# Patient Record
Sex: Male | Born: 1985 | Race: Asian | Hispanic: No | Marital: Married | Smoking: Current some day smoker
Health system: Southern US, Community
[De-identification: ages and names within clinical notes are randomized; demographics above are authoritative.]

## PROBLEM LIST (undated history)

## (undated) HISTORY — PX: LEG SURGERY: SHX1003

---

## 2020-11-18 ENCOUNTER — Other Ambulatory Visit: Payer: Self-pay

## 2020-11-18 ENCOUNTER — Emergency Department (HOSPITAL_BASED_OUTPATIENT_CLINIC_OR_DEPARTMENT_OTHER)
Admission: EM | Admit: 2020-11-18 | Discharge: 2020-11-18 | Disposition: A | Discharge status: Home / Self Care | Payer: PPO | Attending: Emergency Medicine | Admitting: Emergency Medicine

## 2020-11-18 ENCOUNTER — Emergency Department (HOSPITAL_BASED_OUTPATIENT_CLINIC_OR_DEPARTMENT_OTHER): Payer: PPO

## 2020-11-18 DIAGNOSIS — S99922A Unspecified injury of left foot, initial encounter: Secondary | ICD-10-CM | POA: Diagnosis present

## 2020-11-18 DIAGNOSIS — W208XXA Other cause of strike by thrown, projected or falling object, initial encounter: Secondary | ICD-10-CM | POA: Diagnosis not present

## 2020-11-18 DIAGNOSIS — S92425B Nondisplaced fracture of distal phalanx of left great toe, initial encounter for open fracture: Secondary | ICD-10-CM | POA: Diagnosis not present

## 2020-11-18 DIAGNOSIS — S97102A Crushing injury of unspecified left toe(s), initial encounter: Secondary | ICD-10-CM

## 2020-11-18 DIAGNOSIS — Y9239 Other specified sports and athletic area as the place of occurrence of the external cause: Secondary | ICD-10-CM | POA: Diagnosis not present

## 2020-11-18 DIAGNOSIS — S91209A Unspecified open wound of unspecified toe(s) with damage to nail, initial encounter: Secondary | ICD-10-CM

## 2020-11-18 MED ORDER — CEPHALEXIN 500 MG PO CAPS: 500.0000 mg | ORAL_CAPSULE | Freq: Three times a day (TID) | ORAL | 0 refills | Status: DC

## 2020-11-18 MED ORDER — LIDOCAINE HCL 2 % IJ SOLN
10.0000 mL | Freq: Once | INTRAMUSCULAR | Status: AC
  Administered 2020-11-18: 200 mg
  Filled 2020-11-18: qty 20

## 2020-11-18 MED ORDER — TETANUS-DIPHTH-ACELL PERTUSSIS 5-2.5-18.5 LF-MCG/0.5 IM SUSY
0.5000 mL | PREFILLED_SYRINGE | Freq: Once | INTRAMUSCULAR | Status: DC
  Filled 2020-11-18: qty 0.5

## 2020-11-18 MED ORDER — CEFAZOLIN SODIUM-DEXTROSE 1-4 GM/50ML-% IV SOLN
1.0000 g | Freq: Once | INTRAVENOUS | Status: AC
  Administered 2020-11-18: 1 g via INTRAVENOUS
  Filled 2020-11-18: qty 50

## 2020-11-18 MED ORDER — OXYCODONE HCL 5 MG PO TABS: 5.0000 mg | ORAL_TABLET | ORAL | 0 refills | Status: DC | PRN

## 2020-11-18 NOTE — ED Notes (Signed)
Consultation called to [facility] at this time for Orthopedic Surgery to [facility] by this RN.

## 2020-11-18 NOTE — ED Notes (Signed)
Pt verbalizes understanding of discharge instructions. Opportunity for questioning and answers were provided. Armand removed by staff, pt discharged from ED to home. Educated on use of crutches and post-shoes.

## 2020-11-18 NOTE — ED Provider Notes (Signed)
[facility] EMERGENCY DEPT Provider Note   CSN: [id number] Arrival date & time: 11/18/20  1834     History Chief Complaint  Patient presents with   Foot Injury    Lonnie Grimes is a 35 y.o. male.  HPI     35yo male presents with left great toe pain after a weight fell on it at the gym. Occurred 1hr ago. Pain is severe, worse with ambulation. No other injuries.  No numbness/weakness. Not sure last tetanus.   No past medical history on file.  There are no problems to display for this patient.   No family history on file.     Home Medications Prior to Admission medications   Medication Sig Start Date End Date Taking? Authorizing Provider  cephALEXin (KEFLEX) 500 MG capsule Take 1 capsule (500 mg total) by mouth 3 (three) times daily for 7 days. 11/18/20 11/25/20 Yes Alvira Monday, MD  oxyCODONE (ROXICODONE) 5 MG immediate release tablet Take 1 tablet (5 mg total) by mouth every 4 (four) hours as needed for severe pain. 11/18/20  Yes Alvira Monday, MD    Allergies    Patient has no known allergies.  Review of Systems   Review of Systems  Constitutional:  Negative for fever.  Musculoskeletal:  Positive for arthralgias.  Skin:  Positive for wound.   Physical Exam Updated Vital Signs BP 108/80 (BP Location: Right Arm)   Pulse 64   Temp 98.3 F (36.8 C) (Oral)   Resp 20   Ht 5\' 9"  (1.753 m)   Wt 79.4 kg   SpO2 100%   BMI 25.84 kg/m   Physical Exam Vitals and nursing note reviewed.  Constitutional:      General: He is not in acute distress.    Appearance: Normal appearance. He is not ill-appearing, toxic-appearing or diaphoretic.  HENT:     Head: Normocephalic.  Eyes:     Conjunctiva/sclera: Conjunctivae normal.  Cardiovascular:     Rate and Rhythm: Normal rate and regular rhythm.     Pulses: Normal pulses.  Pulmonary:     Effort: Pulmonary effort is normal. No respiratory distress.  Musculoskeletal:        General: Tenderness  (great toe tenderness, able to flex and extend but with pain. normal capillary refill, normal sensation) present. No deformity or signs of injury.     Cervical back: No rigidity.  Skin:    General: Skin is warm and dry.     Coloration: Skin is not jaundiced or pale.     Comments: See photo Avulsed nail with crush injury/laceration proximal to nail  Abrasion over second and third toe   Neurological:     General: No focal deficit present.     Mental Status: He is alert and oriented to person, place, and time.          ED Results / Procedures / Treatments   Labs (all labs ordered are listed, but only abnormal results are displayed) Labs Reviewed - No data to display  EKG None  Radiology DG Foot Complete Left  Result Date: 11/18/2020 CLINICAL DATA:  Injury to great toe EXAM: LEFT FOOT - COMPLETE 3+ VIEW COMPARISON:  None. FINDINGS: Acute mildly comminuted fracture involving tuft of first distal phalanx with about 1/4 shaft diameter volar displacement of distal fracture fragment. No subluxation IMPRESSION: Acute mildly displaced fracture involving the tuft of the first distal phalanx Electronically Signed   By: 11/20/2020 M.D.   On: 11/18/2020 19:29  Procedures Procedures   Medications Ordered in ED Medications  lidocaine (XYLOCAINE) 2 % (with pres) injection 200 mg (200 mg Infiltration Given by Other 11/18/20 2044)  ceFAZolin (ANCEF) IVPB 1 g/50 mL premix (0 g Intravenous Stopped 11/18/20 2244)    ED Course  I have reviewed the triage vital signs and the nursing notes.  Pertinent labs & imaging results that were available during my care of the patient were reviewed by me and considered in my medical decision making (see chart for details).    MDM Rules/Calculators/A&P                           36yo male presents with left great toe pain laceration/nail avulsion after a weight fell on it at the gym. XR shows tuft fracture. TDap updated. Discussed with Dr. Ophelia Charter who is  on call and evaluated photos. Recommend ancef, xeroform dressing. Pt given post-op shoe, crutches for comfort. Discussed risks of narcotic pain medications, reviewed in [place] drug database, recommend primarily using ibuprofen and tylenol. Recommend follow up in one week with Dr. Ophelia Charter. Patient discharged in stable condition with understanding of reasons to return.   Final Clinical Impression(s) / ED Diagnoses Final diagnoses:  Crushing injury of toe of left foot, initial encounter  Open nondisplaced fracture of distal phalanx of left great toe, initial encounter  Avulsion of toenail, initial encounter    Rx / DC Orders ED Discharge Orders          Ordered    cephALEXin (KEFLEX) 500 MG capsule  3 times daily        11/18/20 2234    oxyCODONE (ROXICODONE) 5 MG immediate release tablet  Every 4 hours PRN        11/18/20 2234             Alvira Monday, MD 11/20/20 [phone]

## 2020-11-18 NOTE — Discharge Instructions (Addendum)
You may take acetaminophen (Tylenol) 1000 mg 4 times a day for 1 week. This is the maximum dose of Tylenol you can take from all sources. Please check other over-the-counter medications and prescriptions to ensure you are not taking other medications that contain acetaminophen.  You may also take ibuprofen 400 mg 6 times a day alternating with or at the same time as tylenol.  Take oxycodone as needed for breakthrough pain.  This medication can be addicting, sedating and cause constipation.

## 2020-11-18 NOTE — ED Triage Notes (Signed)
Pt presents to ED POV. Pt c/o injury to L great toe. Pt reports that he dropped weight on foot at gym. Pt not UTD on tetanus.

## 2020-11-20 ENCOUNTER — Encounter (HOSPITAL_BASED_OUTPATIENT_CLINIC_OR_DEPARTMENT_OTHER): Payer: Self-pay

## 2020-11-20 ENCOUNTER — Emergency Department (HOSPITAL_BASED_OUTPATIENT_CLINIC_OR_DEPARTMENT_OTHER)
Admission: EM | Admit: 2020-11-20 | Discharge: 2020-11-20 | Disposition: A | Discharge status: Home / Self Care | Payer: PPO | Attending: Emergency Medicine | Admitting: Emergency Medicine

## 2020-11-20 ENCOUNTER — Other Ambulatory Visit: Payer: Self-pay

## 2020-11-20 DIAGNOSIS — Z5189 Encounter for other specified aftercare: Secondary | ICD-10-CM

## 2020-11-20 DIAGNOSIS — S97112D Crushing injury of left great toe, subsequent encounter: Secondary | ICD-10-CM | POA: Insufficient documentation

## 2020-11-20 DIAGNOSIS — F17210 Nicotine dependence, cigarettes, uncomplicated: Secondary | ICD-10-CM | POA: Diagnosis not present

## 2020-11-20 DIAGNOSIS — Z4800 Encounter for change or removal of nonsurgical wound dressing: Secondary | ICD-10-CM | POA: Insufficient documentation

## 2020-11-20 DIAGNOSIS — W208XXD Other cause of strike by thrown, projected or falling object, subsequent encounter: Secondary | ICD-10-CM | POA: Insufficient documentation

## 2020-11-20 NOTE — ED Notes (Signed)
Applied xeroform and gauze and wrapped it with  Coban to left foot

## 2020-11-20 NOTE — ED Triage Notes (Signed)
Pt came in for wound dressing - pt states he came in bcoz he cant able to change the dressing by himself   Denies pain and fever

## 2020-11-21 ENCOUNTER — Telehealth: Payer: Self-pay | Admitting: Orthopaedic Surgery

## 2020-11-21 NOTE — Telephone Encounter (Signed)
error 

## 2020-11-22 NOTE — ED Provider Notes (Signed)
[facility] EMERGENCY DEPT Provider Note   CSN: [id number] Arrival date & time: 11/20/20  1851     History Chief Complaint  Patient presents with   Dressing Change    Lonnie Grimes is a 35 y.o. male.  HPI     35yo male with history of crush injury today great toe after dropping a weight on his foot at the gym, evaluated by me 2 days ago in the emergency department, presents with concern for wound check.  Reports that he is nervous regarding changing the dressing.  Denies increasing pain, fever or other concerns.  Has other questions regarding his ability to drive, confirming weightbearing status.  History reviewed. No pertinent past medical history.  There are no problems to display for this patient.   History reviewed. No pertinent surgical history.     History reviewed. No pertinent family history.  Social History   Tobacco Use   Smoking status: Some Days    Pack years: 0.00    Types: Cigarettes   Smokeless tobacco: Current  Vaping Use   Vaping Use: Never used    Home Medications Prior to Admission medications   Medication Sig Start Date End Date Taking? Authorizing Provider  cephALEXin (KEFLEX) 500 MG capsule Take 1 capsule (500 mg total) by mouth 3 (three) times daily for 7 days. 11/18/20 11/25/20  Alvira Monday, MD  oxyCODONE (ROXICODONE) 5 MG immediate release tablet Take 1 tablet (5 mg total) by mouth every 4 (four) hours as needed for severe pain. 11/18/20   Alvira Monday, MD    Allergies    Patient has no known allergies.  Review of Systems   Review of Systems  Constitutional:  Negative for fever.  Musculoskeletal:  Positive for arthralgias.  Skin:  Positive for wound.   Physical Exam Updated Vital Signs BP 115/87 (BP Location: Right Arm)   Pulse 68   Temp 98.3 F (36.8 C) (Oral)   Resp 18   Ht 5\' 9"  (1.753 m)   Wt 79.3 kg   SpO2 98%   BMI 25.82 kg/m   Physical Exam Vitals and nursing note reviewed.   Constitutional:      General: He is not in acute distress.    Appearance: Normal appearance. He is not ill-appearing, toxic-appearing or diaphoretic.  HENT:     Head: Normocephalic.  Eyes:     Conjunctiva/sclera: Conjunctivae normal.  Cardiovascular:     Rate and Rhythm: Normal rate and regular rhythm.     Pulses: Normal pulses.  Pulmonary:     Effort: Pulmonary effort is normal. No respiratory distress.  Musculoskeletal:        General: Tenderness and signs of injury present.     Cervical back: No rigidity.  Skin:    General: Skin is warm and dry.     Coloration: Skin is not jaundiced or pale.     Comments: Left great toe with crush/laceration proximal to nail, tissue around wound and nail white  Neurological:     General: No focal deficit present.     Mental Status: He is alert and oriented to person, place, and time.    ED Results / Procedures / Treatments   Labs (all labs ordered are listed, but only abnormal results are displayed) Labs Reviewed - No data to display  EKG None  Radiology No results found.  Procedures Procedures   Medications Ordered in ED Medications - No data to display  ED Course  I have reviewed the triage vital signs  and the nursing notes.  Pertinent labs & imaging results that were available during my care of the patient were reviewed by me and considered in my medical decision making (see chart for details).    MDM Rules/Calculators/A&P                           35yo male with history of crush injury today great toe after dropping a weight on his foot at the gym, evaluated by me 2 days ago in the emergency department, presents with concern for wound check.  Called Dr. Ophelia [facility] to discuss appearance of tissue around wound, feels this is likely expected appearance at 48hr and recommends xeroform dressing and follow up. Dressing placed and to follow up as outpt Discussed with patient he may ambulate normally but if uncomfortable he  may use crutches.   Final Clinical Impression(s) / ED Diagnoses Final diagnoses:  Visit for wound check    Rx / DC Orders ED Discharge Orders     None        Alvira Monday, MD 11/22/20 1057

## 2020-11-24 ENCOUNTER — Other Ambulatory Visit: Payer: Self-pay

## 2020-11-24 ENCOUNTER — Encounter: Payer: Self-pay | Admitting: Surgery

## 2020-11-24 ENCOUNTER — Ambulatory Visit (INDEPENDENT_AMBULATORY_CARE_PROVIDER_SITE_OTHER): Payer: PPO | Admitting: Orthopedic Surgery

## 2020-11-24 ENCOUNTER — Ambulatory Visit: Payer: PPO | Admitting: Surgery

## 2020-11-24 VITALS — BP 98/63 | HR 76 | Ht 69.0 in | Wt 174.8 lb

## 2020-11-24 DIAGNOSIS — S97112A Crushing injury of left great toe, initial encounter: Secondary | ICD-10-CM

## 2020-11-24 MED ORDER — CEPHALEXIN 500 MG PO CAPS: 500.0000 mg | ORAL_CAPSULE | Freq: Four times a day (QID) | ORAL | 0 refills | Status: DC

## 2020-11-24 NOTE — Progress Notes (Signed)
Office Visit Note   Patient: Lonnie Grimes           Date of Birth: July 21, 1985           MRN: [id number] Visit Date: 11/24/2020              Requested by: No referring provider defined for this encounter. PCP: Pcp, No  Chief Complaint  Patient presents with   Left Foot - Pain      HPI: Patient is a 35 year old gentleman who states that he dropped a weight on his left great toe about a week ago.  Patient is currently on Keflex.  Patient is seen in referral from [place].  Assessment & Plan: Visit Diagnoses:  1. Crushing injury of left great toe, initial encounter     Plan: Patient will continue his antibiotics we will follow-up on Monday to change the dressing continue with his crutches ice elevation and anti-inflammatories.  Follow-Up Instructions: Return in about 1 week (around 12/01/2020).   Ortho Exam  Patient is alert, oriented, no adenopathy, well-dressed, normal affect, normal respiratory effort. Examination patient has good hair growth and circulation to the left foot.  He has a crush to the great toe with avulsed great toenail and serosanguineous drainage.  Review of the radiographs shows a tuft fracture without elevated bone.  After informed consent patient underwent a digital block with 10 cc of 1% lidocaine plain.  After adequate local anesthesia were obtained the avulsed nail bed was removed the remainder of the subungual hematoma was decompressed.  The base of the nail bed was elevated and this was reduced.  The germinal matrix was packed open with the Xeroform gauze a sterile dressing was applied plan to follow-up on Monday.  Imaging: No results found. No images are attached to the encounter.  Labs: No results found for: HGBA1C, ESRSEDRATE, CRP, LABURIC, REPTSTATUS, GRAMSTAIN, CULT, LABORGA   No results found for: ALBUMIN, PREALBUMIN, CBC  No results found for: MG No results found for: VD25OH  No results found for: PREALBUMIN No flowsheet data  found.   Body mass index is 25.82 kg/m.  Orders:  No orders of the defined types were placed in this encounter.  Meds ordered this encounter  Medications   cephALEXin (KEFLEX) 500 MG capsule    Sig: Take 1 capsule (500 mg total) by mouth 4 (four) times daily.    Dispense:  40 capsule    Refill:  0     Procedures: No procedures performed  Clinical Data: No additional findings.  ROS:  All other systems negative, except as noted in the HPI. Review of Systems  Objective: Vital Signs: BP 98/63   Pulse 76   Ht 5\' 9"  (1.753 m)   Wt 174 lb 13.3 oz (79.3 kg)   BMI 25.82 kg/m   Specialty Comments:  No specialty comments available.  PMFS History: There are no problems to display for this patient.  History reviewed. No pertinent past medical history.  History reviewed. No pertinent family history.  History reviewed. No pertinent surgical history. Social History   Occupational History   Not on file  Tobacco Use   Smoking status: Some Days    Pack years: 0.00    Types: Cigarettes   Smokeless tobacco: Current  Vaping Use   Vaping Use: Never used  Substance and Sexual Activity   Alcohol use: Not on file   Drug use: Not on file   Sexual activity: Not on file

## 2020-11-28 ENCOUNTER — Encounter: Payer: Self-pay | Admitting: Orthopedic Surgery

## 2020-11-28 ENCOUNTER — Ambulatory Visit (INDEPENDENT_AMBULATORY_CARE_PROVIDER_SITE_OTHER): Payer: PPO | Admitting: Physician Assistant

## 2020-11-28 DIAGNOSIS — S97112A Crushing injury of left great toe, initial encounter: Secondary | ICD-10-CM

## 2020-11-28 MED ORDER — MUPIROCIN 2 % EX OINT: 1.0000 "application " | TOPICAL_OINTMENT | Freq: Two times a day (BID) | CUTANEOUS | 3 refills | Status: AC

## 2020-11-28 NOTE — Progress Notes (Signed)
   Office Visit Note   Patient: Lonnie Grimes           Date of Birth: Nov 30, 1985           MRN: [id number] Visit Date: 11/28/2020              Requested by: No referring provider defined for this encounter. PCP: Pcp, No  Chief Complaint  Patient presents with   Left Foot - Follow-up    GT dropped weight on foot. Removal of nail in office      HPI: Presents in follow-up today for his open tuft fracture of his big toe.  Dr. Lajoyce Corners remove the toenail last week.  He has had a dressing on and has been taking Keflex.  He is going to be traveling for 6 weeks to the [place]  Assessment & Plan: Visit Diagnoses: No diagnosis found.  Plan: Patient was seen by Dr. Lajoyce Corners as well.  He recommended daily cleansing with antibacterial soap and water should place a small amount of Bactroban on the area with a Band-Aid.  I ordered this for him today.  Should remain in the postop shoe until he sees regrowth of the nail.  He understands if he had any increasing drainage redness or pain he should go and be evaluated by a physician  Follow-Up Instructions: No follow-ups on file.   Ortho Exam  Patient is alert, oriented, no adenopathy, well-dressed, normal affect, normal respiratory effort. Examination nailbed has healthy tissue.  There is no purulent odor or surrounding cellulitis.  Swelling is well controlled.  No evidence of infection  Imaging: No results found. No images are attached to the encounter.  Labs: No results found for: HGBA1C, ESRSEDRATE, CRP, LABURIC, REPTSTATUS, GRAMSTAIN, CULT, LABORGA   No results found for: ALBUMIN, PREALBUMIN, CBC  No results found for: MG No results found for: VD25OH  No results found for: PREALBUMIN No flowsheet data found.   There is no height or weight on file to calculate BMI.  Orders:  No orders of the defined types were placed in this encounter.  Meds ordered this encounter  Medications   mupirocin ointment (BACTROBAN) 2 %    Sig:  Apply 1 application topically 2 (two) times daily. Apply to the affected area 2 times a day    Dispense:  22 g    Refill:  3     Procedures: No procedures performed  Clinical Data: No additional findings.  ROS:  All other systems negative, except as noted in the HPI. Review of Systems  Objective: Vital Signs: There were no vitals taken for this visit.  Specialty Comments:  No specialty comments available.  PMFS History: There are no problems to display for this patient.  No past medical history on file.  No family history on file.  No past surgical history on file. Social History   Occupational History   Not on file  Tobacco Use   Smoking status: Some Days    Pack years: 0.00    Types: Cigarettes   Smokeless tobacco: Current  Vaping Use   Vaping Use: Never used  Substance and Sexual Activity   Alcohol use: Not on file   Drug use: Not on file   Sexual activity: Not on file

## 2021-02-08 ENCOUNTER — Other Ambulatory Visit: Payer: Self-pay

## 2021-02-08 ENCOUNTER — Ambulatory Visit (INDEPENDENT_AMBULATORY_CARE_PROVIDER_SITE_OTHER): Payer: PPO | Admitting: Physician Assistant

## 2021-02-08 ENCOUNTER — Encounter: Payer: Self-pay | Admitting: Physician Assistant

## 2021-02-08 VITALS — BP 97/60 | HR 79 | Temp 98.3°F | Ht 69.0 in | Wt 199.2 lb

## 2021-02-08 DIAGNOSIS — E559 Vitamin D deficiency, unspecified: Secondary | ICD-10-CM | POA: Diagnosis not present

## 2021-02-08 DIAGNOSIS — Z0001 Encounter for general adult medical examination with abnormal findings: Secondary | ICD-10-CM

## 2021-02-08 DIAGNOSIS — Z13220 Encounter for screening for lipoid disorders: Secondary | ICD-10-CM

## 2021-02-08 DIAGNOSIS — R21 Rash and other nonspecific skin eruption: Secondary | ICD-10-CM | POA: Diagnosis not present

## 2021-02-08 DIAGNOSIS — G44219 Episodic tension-type headache, not intractable: Secondary | ICD-10-CM | POA: Diagnosis not present

## 2021-02-08 DIAGNOSIS — Z136 Encounter for screening for cardiovascular disorders: Secondary | ICD-10-CM

## 2021-02-08 MED ORDER — TRIAMCINOLONE ACETONIDE 0.5 % EX OINT: 1.0000 "application " | TOPICAL_OINTMENT | Freq: Two times a day (BID) | CUTANEOUS | 0 refills | Status: AC

## 2021-02-08 NOTE — Progress Notes (Signed)
Subjective:    Lonnie Grimes is a 35 y.o. male and is here for a comprehensive physical exam.  HPI  Health Maintenance Due  Topic Date Due   Pneumococcal Vaccine 38-74 Years old (1 - PCV) Never done   HIV Screening  Never done   Hepatitis C Screening  Never done    Acute Concerns: Rash -- for the past couple of weeks he has intermittent areas of rash on his arms, legs, torso. Small red areas. Denies changes in foods, family members with similar rash, changes to environrment. Getting better with time. Using OTC cream, unsure of what it is. Headaches -- getting 1-2 times a week. Pain is 7/10. Bilateral temples. Happens during the weekdays, doesn't happen on weekend. Will take tylenol for his symptoms. Tylenol does help. Drinks plenty of water. Denies concern for vision changes, numbness/tingling in extremities, weakness. Denies nausea or prior migraine hx. Feels like it is due to stress.  Chronic Issues: Vitamin D deficiency - started 3 years ago; takes two times a week; unsure of exact dosage  Health Maintenance: Immunizations -- declines flu Colonoscopy -- n/a PSA -- No results found for: PSA1, PSA Diet -- overall healthy; eats all food groups Sleep habits -- no concerns Exercise -- as able Weight -- Weight: 199 lb 3.2 oz (90.4 kg)  Weight history Wt Readings from Last 10 Encounters:  02/08/21 199 lb 3.2 oz (90.4 kg)  11/24/20 174 lb 13.3 oz (79.3 kg)  11/20/20 174 lb 13.2 oz (79.3 kg)  11/18/20 175 lb (79.4 kg)   Body mass index is 29.42 kg/m. Mood -- no concerns; denies SI/HI Tobacco use --  Tobacco Use: High Risk   Smoking Tobacco Use: Some Days   Smokeless Tobacco Use: Current    Alcohol use ---  reports current alcohol use.   Depression screen PHQ 2/9 02/08/2021  Decreased Interest 0  Down, Depressed, Hopeless 0  PHQ - 2 Score 0     Other providers/specialists: Patient Care Team: Jarold Motto, [place] as PCP - General (Physician Assistant)   PMHx,  SurgHx, SocialHx, Medications, and Allergies were reviewed in the Visit Navigator and updated as appropriate.   History reviewed. No pertinent past medical history.   Past Surgical History:  Procedure Laterality Date   LEG SURGERY Right     History reviewed. No pertinent family history.  Social History   Tobacco Use   Smoking status: Some Days    Types: Cigarettes   Smokeless tobacco: Current  Vaping Use   Vaping Use: Some days  Substance Use Topics   Alcohol use: Yes    Comment: occ   Drug use: Never    Review of Systems:   Review of Systems  Constitutional:  Negative for chills, fever, malaise/fatigue and weight loss.  HENT:  Negative for hearing loss, sinus pain and sore throat.   Respiratory:  Negative for cough and hemoptysis.   Cardiovascular:  Negative for chest pain, palpitations, leg swelling and PND.  Gastrointestinal:  Negative for abdominal pain, constipation, diarrhea, heartburn, nausea and vomiting.  Genitourinary:  Negative for dysuria, frequency and urgency.  Musculoskeletal:  Negative for back pain, myalgias and neck pain.  Skin:  Positive for itching and rash.  Neurological:  Positive for headaches. Negative for dizziness, tingling and seizures.  Endo/Heme/Allergies:  Negative for polydipsia.  Psychiatric/Behavioral:  Negative for depression. The patient is not nervous/anxious.    Objective:   Vitals:   02/08/21 1527  BP: 97/60  Pulse: 79  Temp: 98.3 F (36.8 C)  SpO2: 98%   Body mass index is 29.42 kg/m.  General Appearance:  Alert, cooperative, no distress, appears stated age  Head:  Normocephalic, without obvious abnormality, atraumatic  Eyes:  PERRL, conjunctiva/corneas clear, EOM's intact, fundi benign, both eyes       Ears:  Normal TM's and external ear canals, both ears  Nose: Nares normal, septum midline, mucosa normal, no drainage    or sinus tenderness  Throat: Lips, mucosa, and tongue normal; teeth and gums normal  Neck: Supple,  symmetrical, trachea midline, no adenopathy; thyroid:  No enlargement/tenderness/nodules; no carotit bruit or JVD  Back:   Symmetric, no curvature, ROM normal, no CVA tenderness  Lungs:   Clear to auscultation bilaterally, respirations unlabored  Chest wall:  No tenderness or deformity  Heart:  Regular rate and rhythm, S1 and S2 normal, no murmur, rub   or gallop  Abdomen:   Soft, non-tender, bowel sounds active all four quadrants, no masses, no organomegaly  Extremities: Extremities normal, atraumatic, no cyanosis or edema  Prostate: Not done.   Skin: Skin color, texture, turgor normal Scant scattered areas of well-demarcated erythema without TTP or active discharge  Lymph nodes: Cervical, supraclavicular, and axillary nodes normal  Neurologic: CNII-XII grossly intact. Normal strength, sensation and reflexes throughout    Assessment/Plan:   1. Encounter for general adult medical examination with abnormal findings Today patient counseled on age appropriate routine health concerns for screening and prevention, each reviewed and up to date or declined. Immunizations reviewed and up to date or declined. Labs ordered and reviewed. Risk factors for depression reviewed and negative. Hearing function and visual acuity are intact. ADLs screened and addressed as needed. Functional ability and level of safety reviewed and appropriate. Education, counseling and referrals performed based on assessed risks today. Patient provided with a copy of personalized plan for preventive services.   2. Rash Possible contact dermatitis Trial topical triamcinolone If lack of improvement, will refer to derm  3. Vitamin D deficiency Update Vit D and provide recommendations on supplementation  4. Episodic tension-type headache, not intractable No red flags on exam or hx Work on hydration, stress mgmt, adequate nutrition May take prn excedrin migraine HA handout provided Follow-up if lack of improvement or new  red flag sx  5. Encounter for lipid screening for cardiovascular disease Update lipid panel and provide recommendations accordingly   Patient Counseling: [x]   Nutrition: Stressed importance of moderation in sodium/caffeine intake, saturated fat and cholesterol, caloric balance, sufficient intake of fresh fruits, vegetables, and fiber.  [x]   Stressed the importance of regular exercise.   []   Substance Abuse: Discussed cessation/primary prevention of tobacco, alcohol, or other drug use; driving or other dangerous activities under the influence; availability of treatment for abuse.   [x]   Injury prevention: Discussed safety belts, safety helmets, smoke detector, smoking near bedding or upholstery.   []   Sexuality: Discussed sexually transmitted diseases, partner selection, use of condoms, avoidance of unintended pregnancy  and contraceptive alternatives.   [x]   Dental health: Discussed importance of regular tooth brushing, flossing, and dental visits.  [x]   Health maintenance and immunizations reviewed. Please refer to Health maintenance section.    , PA-C [facility]

## 2021-02-08 NOTE — Patient Instructions (Addendum)
It was great to see you!  Please use the ointment for your rash  Headache recommendations Trial over the counter excedrin migraine 2.  Take Excedrin Migraine at earliest onset of headache.   3.  Limit use of pain relievers to no more than 2 days out of the week.  These medications include acetaminophen, ibuprofen, triptans and narcotics.  This will help reduce risk of rebound headaches. 4.  Be aware of common food triggers such as processed sweets, processed foods with nitrites (such as deli meat, hot dogs, sausages), foods with MSG, alcohol (such as wine), chocolate, certain cheeses, certain fruits (dried fruits, bananas, pineapple), vinegar, diet soda. 4.  Avoid caffeine 5.  Routine exercise 6.  Proper sleep hygiene 7.  Stay adequately hydrated with water 8.  Keep a headache diary. 9.  Maintain proper stress management. 10.  Do not skip meals. 11.  Consider supplements:  Magnesium citrate 400mg  to 600mg  daily, riboflavin 400mg , Coenzyme Q 10 100mg  three times daily  Headache Precautions: Contact a doctor if: Your symptoms are not helped by medicine. You have a headache that feels different than the other headaches. You feel sick to your stomach (nauseous) or you throw up (vomit). You have a fever. Get help right away if: Your headache gets very bad quickly. Your headache gets worse after a lot of physical activity. You keep throwing up. You have a stiff neck. You have trouble seeing. You have trouble speaking. You have pain in the eye or ear. Your muscles are weak or you lose muscle control. You lose your balance or have trouble walking. You feel like you will pass out (faint) or you pass out. You are mixed up (confused). You have a seizure.   Please go to the lab for blood work.   Our office will call you with your results unless you have chosen to receive results via MyChart.  If your blood work is normal we will follow-up each year for physicals and as scheduled for  chronic medical problems.  If anything is abnormal we will treat accordingly and get you in for a follow-up.  Take care,  

## 2021-02-09 LAB — LIPID PANEL
Cholesterol: 176 mg/dL (ref 0–200)
HDL: 44.2 mg/dL (ref >39.00)
LDL Cholesterol: 106 mg/dL — ABNORMAL HIGH (ref 0–99)
NonHDL: 132.04
Total CHOL/HDL Ratio: 4
Triglycerides: 132 mg/dL (ref 0.0–149.0)
VLDL: 26.4 mg/dL (ref 0.0–40.0)

## 2021-02-09 LAB — CBC WITH DIFFERENTIAL/PLATELET
Basophils Absolute: 0 10*3/uL (ref 0.0–0.1)
Basophils Relative: 0.8 % (ref 0.0–3.0)
Eosinophils Absolute: 0 10*3/uL (ref 0.0–0.7)
Eosinophils Relative: 0.7 % (ref 0.0–5.0)
HCT: 42.2 % (ref 39.0–52.0)
Hemoglobin: 14.1 g/dL (ref 13.0–17.0)
Lymphocytes Relative: 32.1 % (ref 12.0–46.0)
Lymphs Abs: 1.9 10*3/uL (ref 0.7–4.0)
MCHC: 33.4 g/dL (ref 30.0–36.0)
MCV: 87.4 fl (ref 78.0–100.0)
Monocytes Absolute: 0.5 10*3/uL (ref 0.1–1.0)
Monocytes Relative: 8.3 % (ref 3.0–12.0)
Neutro Abs: 3.3 10*3/uL (ref 1.4–7.7)
Neutrophils Relative %: 58.1 % (ref 43.0–77.0)
Platelets: 195 10*3/uL (ref 150.0–400.0)
RBC: 4.83 Mil/uL (ref 4.22–5.81)
RDW: 13.5 % (ref 11.5–15.5)
WBC: 5.8 10*3/uL (ref 4.0–10.5)

## 2021-02-09 LAB — COMPREHENSIVE METABOLIC PANEL
ALT: 45 U/L (ref 0–53)
AST: 25 U/L (ref 0–37)
Albumin: 4.3 g/dL (ref 3.5–5.2)
Alkaline Phosphatase: 75 U/L (ref 39–117)
BUN: 9 mg/dL (ref 6–23)
CO2: 28 mEq/L (ref 19–32)
Calcium: 9.4 mg/dL (ref 8.4–10.5)
Chloride: 103 mEq/L (ref 96–112)
Creatinine, Ser: 0.79 mg/dL (ref 0.40–1.50)
GFR: 115.55 mL/min (ref >60.00)
Glucose, Bld: 80 mg/dL (ref 70–99)
Potassium: 4.3 mEq/L (ref 3.5–5.1)
Sodium: 139 mEq/L (ref 135–145)
Total Bilirubin: 0.4 mg/dL (ref 0.2–1.2)
Total Protein: 7.4 g/dL (ref 6.0–8.3)

## 2021-02-09 LAB — VITAMIN D 25 HYDROXY (VIT D DEFICIENCY, FRACTURES): VITD: 23.98 ng/mL — ABNORMAL LOW (ref 30.00–100.00)

## 2021-02-20 ENCOUNTER — Ambulatory Visit: Payer: PPO | Admitting: Physician Assistant

## 2021-02-20 NOTE — Progress Notes (Deleted)
Lonnie Grimes is a 35 y.o. male here for a new problem.  I acted as a Neurosurgeon for [employer], PA-C [employer], LPN   History of Present Illness:   No chief complaint on file.   HPI  Back pain   No past medical history on file.   Social History   Tobacco Use   Smoking status: Some Days    Types: Cigarettes   Smokeless tobacco: Current  Vaping Use   Vaping Use: Some days  Substance Use Topics   Alcohol use: Yes    Comment: occ   Drug use: Never    Past Surgical History:  Procedure Laterality Date   LEG SURGERY Right     No family history on file.  No Known Allergies  Current Medications:   Current Outpatient Medications:    cephALEXin (KEFLEX) 500 MG capsule, Take 1 capsule (500 mg total) by mouth 4 (four) times daily., Disp: 40 capsule, Rfl: 0   mupirocin ointment (BACTROBAN) 2 %, Apply 1 application topically 2 (two) times daily. Apply to the affected area 2 times a day, Disp: 22 g, Rfl: 3   oxyCODONE (ROXICODONE) 5 MG immediate release tablet, Take 1 tablet (5 mg total) by mouth every 4 (four) hours as needed for severe pain., Disp: 5 tablet, Rfl: 0   triamcinolone ointment (KENALOG) 0.5 %, Apply 1 application topically 2 (two) times daily., Disp: 30 g, Rfl: 0   Review of Systems:   ROS  Vitals:   There were no vitals filed for this visit.   There is no height or weight on file to calculate BMI.  Physical Exam:   Physical Exam  Assessment and Plan:   There are no diagnoses linked to this encounter.     ***  Jarold Motto, PA-C

## 2021-02-23 ENCOUNTER — Encounter: Payer: Self-pay | Admitting: Family

## 2021-02-23 ENCOUNTER — Ambulatory Visit (INDEPENDENT_AMBULATORY_CARE_PROVIDER_SITE_OTHER): Payer: PPO | Admitting: Family

## 2021-02-23 ENCOUNTER — Other Ambulatory Visit: Payer: Self-pay

## 2021-02-23 VITALS — BP 106/69 | HR 79 | Temp 98.3°F | Ht 69.0 in | Wt 199.8 lb

## 2021-02-23 DIAGNOSIS — M546 Pain in thoracic spine: Secondary | ICD-10-CM

## 2021-02-23 DIAGNOSIS — L739 Follicular disorder, unspecified: Secondary | ICD-10-CM | POA: Diagnosis not present

## 2021-02-23 MED ORDER — CEPHALEXIN 500 MG PO CAPS: 500.0000 mg | ORAL_CAPSULE | Freq: Three times a day (TID) | ORAL | 0 refills | Status: DC

## 2021-02-24 ENCOUNTER — Ambulatory Visit (INDEPENDENT_AMBULATORY_CARE_PROVIDER_SITE_OTHER)
Admission: RE | Admit: 2021-02-24 | Discharge: 2021-02-24 | Disposition: A | Discharge status: Home / Self Care | Payer: PPO | Source: Ambulatory Visit | Attending: Family | Admitting: Family

## 2021-02-24 DIAGNOSIS — M546 Pain in thoracic spine: Secondary | ICD-10-CM

## 2021-02-24 NOTE — Progress Notes (Signed)
Acute Office Visit  Subjective:    Patient ID: Lonnie Grimes, male    DOB: 11-09-1985, 35 y.o.   MRN: [id number]  Chief Complaint  Patient presents with   Back Pain    Started 2 weeks ago. He says he has used Radiation protection practitioner.     HPI Patient is in today with c/o right upper back pain x 2 weeks that has improved. Pain initially was 8/10. Has been using BenGay. Pain was described as sharp with movement and a dull ache. Patient reports doing upper body Pain is better, 2-3/10. Patient has been lifting weights in the gym but has not worked out in 1 week. Does not have any pain today.   Patient also reports a red, itchy rash to the arms bilaterally. Was see and instructed to use Bactroban. Reports using it but is no better. No fever or chills.    History reviewed. No pertinent past medical history.  Past Surgical History:  Procedure Laterality Date   LEG SURGERY Right     History reviewed. No pertinent family history.  Social History   Socioeconomic History   Marital status: Married    Spouse name: Not on file   Number of children: Not on file   Years of education: Not on file   Highest education level: Not on file  Occupational History   Not on file  Tobacco Use   Smoking status: Some Days    Types: Cigarettes   Smokeless tobacco: Current  Vaping Use   Vaping Use: Some days  Substance and Sexual Activity   Alcohol use: Yes    Comment: occ   Drug use: Never   Sexual activity: Not on file  Other Topics Concern   Not on file  Social History Narrative   From [place]   Currently a Consulting civil engineer at [employer] with [employer]   Has wife and daughters   Social Determinants of Health   Financial Resource Strain: Not on file  Food Insecurity: Not on file  Transportation Needs: Not on file  Physical Activity: Not on file  Stress: Not on file  Social Connections: Not on file  Intimate Partner Violence: Not on file    Outpatient Medications Prior to Visit  Medication Sig Dispense  Refill   mupirocin ointment (BACTROBAN) 2 % Apply 1 application topically 2 (two) times daily. Apply to the affected area 2 times a day 22 g 3   triamcinolone ointment (KENALOG) 0.5 % Apply 1 application topically 2 (two) times daily. 30 g 0   oxyCODONE (ROXICODONE) 5 MG immediate release tablet Take 1 tablet (5 mg total) by mouth every 4 (four) hours as needed for severe pain. (Patient not taking: Reported on 02/23/2021) 5 tablet 0   cephALEXin (KEFLEX) 500 MG capsule Take 1 capsule (500 mg total) by mouth 4 (four) times daily. (Patient not taking: Reported on 02/23/2021) 40 capsule 0   No facility-administered medications prior to visit.    No Known Allergies  Review of Systems  Constitutional: Negative.   Respiratory: Negative.    Cardiovascular: Negative.   Gastrointestinal: Negative.   Endocrine: Negative.   Musculoskeletal:  Positive for back pain and myalgias.  Skin:  Positive for rash.       Red, itchy bumps on both arms.   Neurological: Negative.   Psychiatric/Behavioral: Negative.    All other systems reviewed and are negative.     Objective:    Physical Exam Vitals and nursing note reviewed.  Constitutional:  Appearance: Normal appearance.  Cardiovascular:     Rate and Rhythm: Normal rate and regular rhythm.  Pulmonary:     Effort: Pulmonary effort is normal.     Breath sounds: Normal breath sounds.  Musculoskeletal:        General: Tenderness present. No signs of injury.     Cervical back: Normal range of motion and neck supple.     Right lower leg: No edema.     Left lower leg: No edema.     Comments: Right upper back  Skin:    General: Skin is warm and dry.     Findings: Rash present.     Comments: Erythematous, papular rash noted to the arms bilaterally. No pattern. Scattered. No drainage or discharge  Neurological:     General: No focal deficit present.     Mental Status: He is alert and oriented to person, place, and time.  Psychiatric:        Mood  and Affect: Mood normal.        Behavior: Behavior normal.   BP 106/69   Pulse 79   Temp 98.3 F (36.8 C) (Temporal)   Ht 5\' 9"  (1.753 m)   Wt 199 lb 12.8 oz (90.6 kg)   SpO2 98%   BMI 29.51 kg/m  Wt Readings from Last 3 Encounters:  02/23/21 199 lb 12.8 oz (90.6 kg)  02/08/21 199 lb 3.2 oz (90.4 kg)  11/24/20 174 lb 13.3 oz (79.3 kg)    Health Maintenance Due  Topic Date Due   HIV Screening  Never done   Hepatitis C Screening  Never done    There are no preventive care reminders to display for this patient.   No results found for: TSH Lab Results  Component Value Date   WBC 5.8 02/08/2021   HGB 14.1 02/08/2021   HCT 42.2 02/08/2021   MCV 87.4 02/08/2021   PLT 195.0 02/08/2021   Lab Results  Component Value Date   NA 139 02/08/2021   K 4.3 02/08/2021   CO2 28 02/08/2021   GLUCOSE 80 02/08/2021   BUN 9 02/08/2021   CREATININE 0.79 02/08/2021   BILITOT 0.4 02/08/2021   ALKPHOS 75 02/08/2021   AST 25 02/08/2021   ALT 45 02/08/2021   PROT 7.4 02/08/2021   ALBUMIN 4.3 02/08/2021   CALCIUM 9.4 02/08/2021   GFR 115.55 02/08/2021   Lab Results  Component Value Date   CHOL 176 02/08/2021   Lab Results  Component Value Date   HDL 44.20 02/08/2021   Lab Results  Component Value Date   LDLCALC 106 (H) 02/08/2021   Lab Results  Component Value Date   TRIG 132.0 02/08/2021   Lab Results  Component Value Date   CHOLHDL 4 02/08/2021   No results found for: HGBA1C     Assessment & Plan:   Problem List Items Addressed This Visit   None Visit Diagnoses     Acute right-sided thoracic back pain    -  Primary   Relevant Orders   DG Thoracic Spine W/Swimmers   Folliculitis            Meds ordered this encounter  Medications   DISCONTD: cephALEXin (KEFLEX) 500 MG capsule    Sig: Take 1 capsule (500 mg total) by mouth 3 (three) times daily.    Dispense:  21 capsule    Refill:  0   cephALEXin (KEFLEX) 500 MG capsule    Sig: Take 1 capsule (500  mg total) by mouth 3 (three) times daily.    Dispense:  21 capsule    Refill:  0   Back pain most likely related to newly initiated weight training with chest/back exercises. Ibuprofen as needed, ice, heat, massage therapy. Cephalexin prescribed for Folliculitis. Call the office with any questions or concerns. Recheck as scheduled and sooner as needed.   Eulis Foster, FNP

## 2021-07-19 ENCOUNTER — Ambulatory Visit (INDEPENDENT_AMBULATORY_CARE_PROVIDER_SITE_OTHER): Payer: PPO | Admitting: Physician Assistant

## 2021-07-19 ENCOUNTER — Other Ambulatory Visit: Payer: Self-pay

## 2021-07-19 ENCOUNTER — Encounter: Payer: Self-pay | Admitting: Physician Assistant

## 2021-07-19 VITALS — BP 110/70 | HR 80 | Temp 98.4°F | Ht 69.0 in | Wt 207.5 lb

## 2021-07-19 DIAGNOSIS — L0591 Pilonidal cyst without abscess: Secondary | ICD-10-CM | POA: Diagnosis not present

## 2021-07-19 NOTE — Patient Instructions (Signed)
It was great to see you!  Stop the triamcinolone ointment, instead, use the mupirocin ointment (BACTROBAN) 2 % on your medication list.  I'm putting in a referral to a general surgeon to have them take a look at your incision site  Take care,  Inda Coke PA-C

## 2021-07-19 NOTE — Progress Notes (Signed)
Lonnie Grimes is a 36 y.o. male here for follow-up.  History of Present Illness:   Chief Complaint  Patient presents with   Pilonadil cyst    Pt noticed a cyst about a week ago. He is having some itching. No drainage.    HPI  Cyst Lonnie Grimes presents with c/o cyst on his upper buttock crevice that has been onset for about a week. Pt describes the cyst to be itchy often with slight drainage following scratching. About 10 years ago, he did have a pilonadil cyst in the same area removed and despite recommended preventative treatment, he believes it has returned. He reports that during the previous surgery, the entire sac was removed. In an effort to manage his itchiness, he has been applying previously prescribed kenalog 0.5% topical cream which has provided no relief. At this time he is interested in having this removed once more.   Denies fever, chills, or bleeding.    History reviewed. No pertinent past medical history.   Social History   Tobacco Use   Smoking status: Some Days    Types: Cigarettes   Smokeless tobacco: Current  Vaping Use   Vaping Use: Some days  Substance Use Topics   Alcohol use: Yes    Comment: occ   Drug use: Never    Past Surgical History:  Procedure Laterality Date   LEG SURGERY Right     History reviewed. No pertinent family history.  No Known Allergies  Current Medications:   Current Outpatient Medications:    mupirocin ointment (BACTROBAN) 2 %, Apply 1 application topically 2 (two) times daily. Apply to the affected area 2 times a day, Disp: 22 g, Rfl: 3   triamcinolone ointment (KENALOG) 0.5 %, Apply 1 application topically 2 (two) times daily., Disp: 30 g, Rfl: 0   Review of Systems:   ROS Negative unless otherwise specified per HPI. Vitals:   Vitals:   07/19/21 1036  BP: 110/70  Pulse: 80  Temp: 98.4 F (36.9 C)  TempSrc: Temporal  SpO2: 96%  Weight: 207 lb 8 oz (94.1 kg)  Height: 5\' 9"  (1.753 m)     Body  mass index is 30.64 kg/m.  Physical Exam:   Physical Exam Vitals and nursing note reviewed.  Constitutional:      Appearance: He is well-developed.  HENT:     Head: Normocephalic.  Eyes:     Conjunctiva/sclera: Conjunctivae normal.     Pupils: Pupils are equal, round, and reactive to light.  Pulmonary:     Effort: Pulmonary effort is normal.  Musculoskeletal:        General: Normal range of motion.     Cervical back: Normal range of motion.  Skin:    General: Skin is warm and dry.     Comments: Well-approximated linear area of erythema in gluteal cleft with superficial skin loss; no obvious drainage/TTP  Neurological:     Mental Status: He is alert and oriented to person, place, and time.  Psychiatric:        Behavior: Behavior normal.        Thought Content: Thought content normal.        Judgment: Judgment normal.       Assessment and Plan:   Pilonidal cyst No signs of infection I'm concerned his steroid cream is causing re-opening of the area Stop use of Kenalog 0.5% topical cream Trial Bactroban 2% topical cream twice daily to affected area Will refer to general surgery for further evaluation  If  worsening symptoms in the meantime, patient was advised to let us know  I,Havlyn C Ratchford,acting as a scribe for [employer], PA.,have documented all relevant documentation on the behalf of Jarold Motto, PA,as directed by  Jarold Motto, PA while in the presence of Jarold Motto, [place].  I, Jarold Motto, [place], have reviewed all documentation for this visit. The documentation on 07/19/21 for the exam, diagnosis, procedures, and orders are all accurate and complete.   Jarold Motto, PA-C

## 2021-07-25 ENCOUNTER — Ambulatory Visit: Payer: PPO | Admitting: Physician Assistant

## 2021-09-21 ENCOUNTER — Ambulatory Visit: Payer: PPO | Admitting: Physician Assistant

## 2021-10-02 ENCOUNTER — Ambulatory Visit: Payer: PPO | Admitting: Physician Assistant

## 2021-10-02 NOTE — Progress Notes (Incomplete)
Lonnie Grimes is a 36 y.o. male here for a headache. ? ?History of Present Illness:  ? ?No chief complaint on file. ? ? ?HPI ?Headache ? ?No past medical history on file. ?  ?Social History  ? ?Tobacco Use  ? Smoking status: Some Days  ?  Types: Cigarettes  ? Smokeless tobacco: Current  ?Vaping Use  ? Vaping Use: Some days  ?Substance Use Topics  ? Alcohol use: Yes  ?  Comment: occ  ? Drug use: Never  ? ? ?Past Surgical History:  ?Procedure Laterality Date  ? LEG SURGERY Right   ? ? ?No family history on file. ? ?No Known Allergies ? ?Current Medications:  ? ?Current Outpatient Medications:  ?  mupirocin ointment (BACTROBAN) 2 %, Apply 1 application topically 2 (two) times daily. Apply to the affected area 2 times a day, Disp: 22 g, Rfl: 3 ?  triamcinolone ointment (KENALOG) 0.5 %, Apply 1 application topically 2 (two) times daily., Disp: 30 g, Rfl: 0  ? ?Review of Systems:  ? ?ROS ? ?Vitals:  ? ?There were no vitals filed for this visit.   ?There is no height or weight on file to calculate BMI. ? ?Physical Exam:  ? ?Physical Exam ? ?Assessment and Plan:  ? ?@DIAGLIST @ ? ? ? ? ?*** ? ?Inda Coke, PA-C ? ?

## 2022-02-26 ENCOUNTER — Encounter: Payer: Self-pay | Admitting: *Deleted

## 2022-05-17 ENCOUNTER — Encounter: Payer: Self-pay | Admitting: *Deleted

## 2023-02-12 IMAGING — DX DG FOOT COMPLETE 3+V*L*
2 series · 3 of 3 positions shown · non-contrast
Comparison: None.

CLINICAL DATA: Injury to great toe

EXAM:
LEFT FOOT - COMPLETE 3+ VIEW

[Series 1: foot · 0.14mm/px · 2 of 2 slices shown]
[im 1/2]
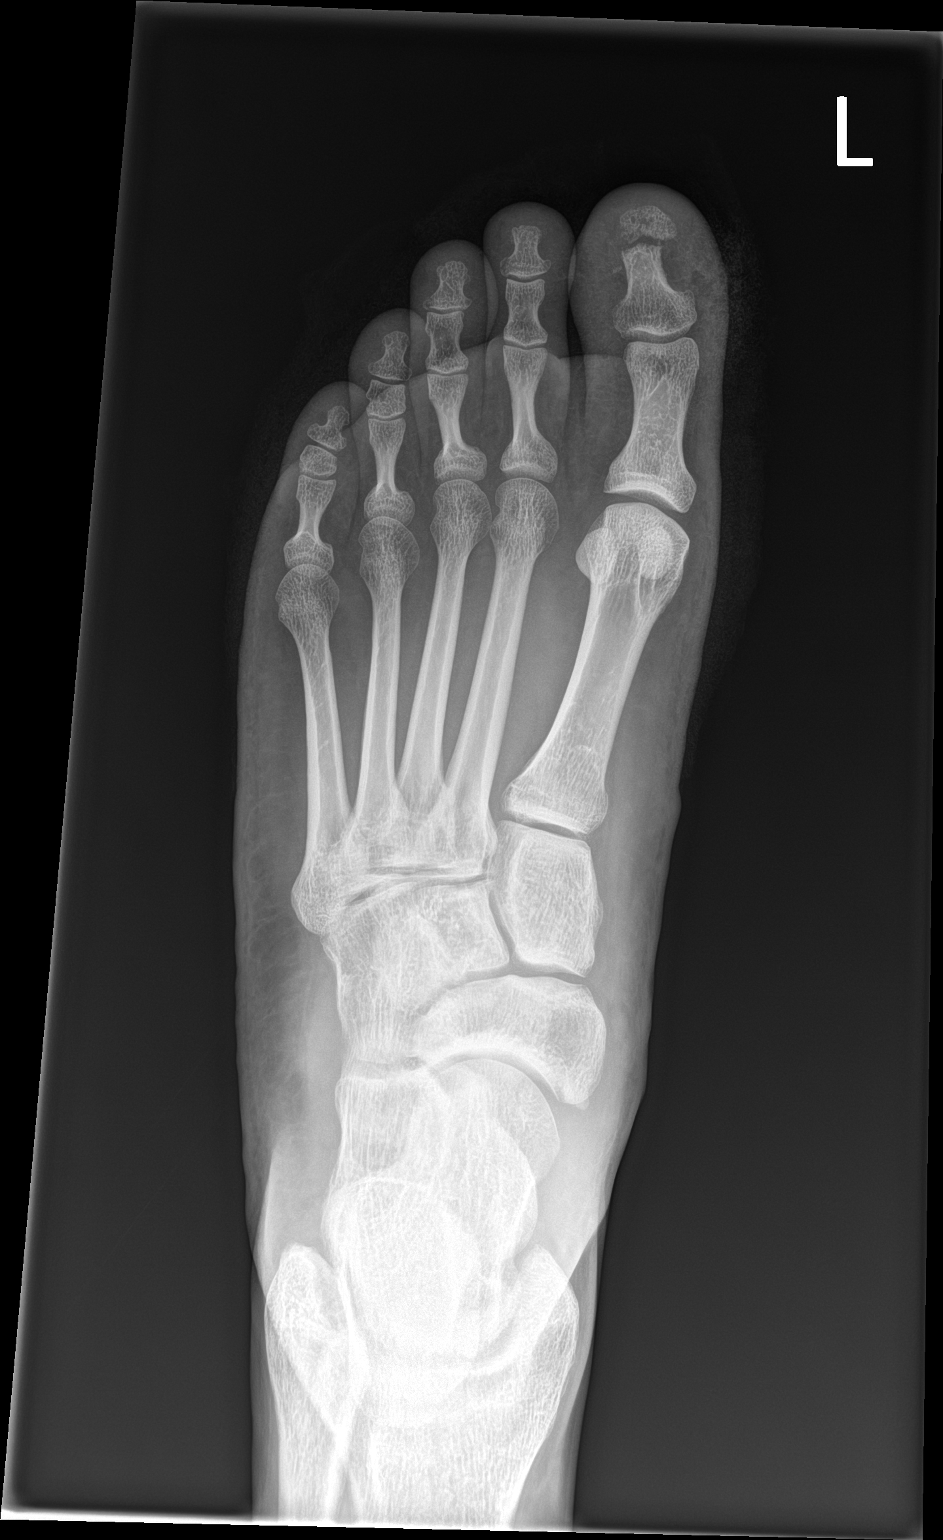
[im 2/2]
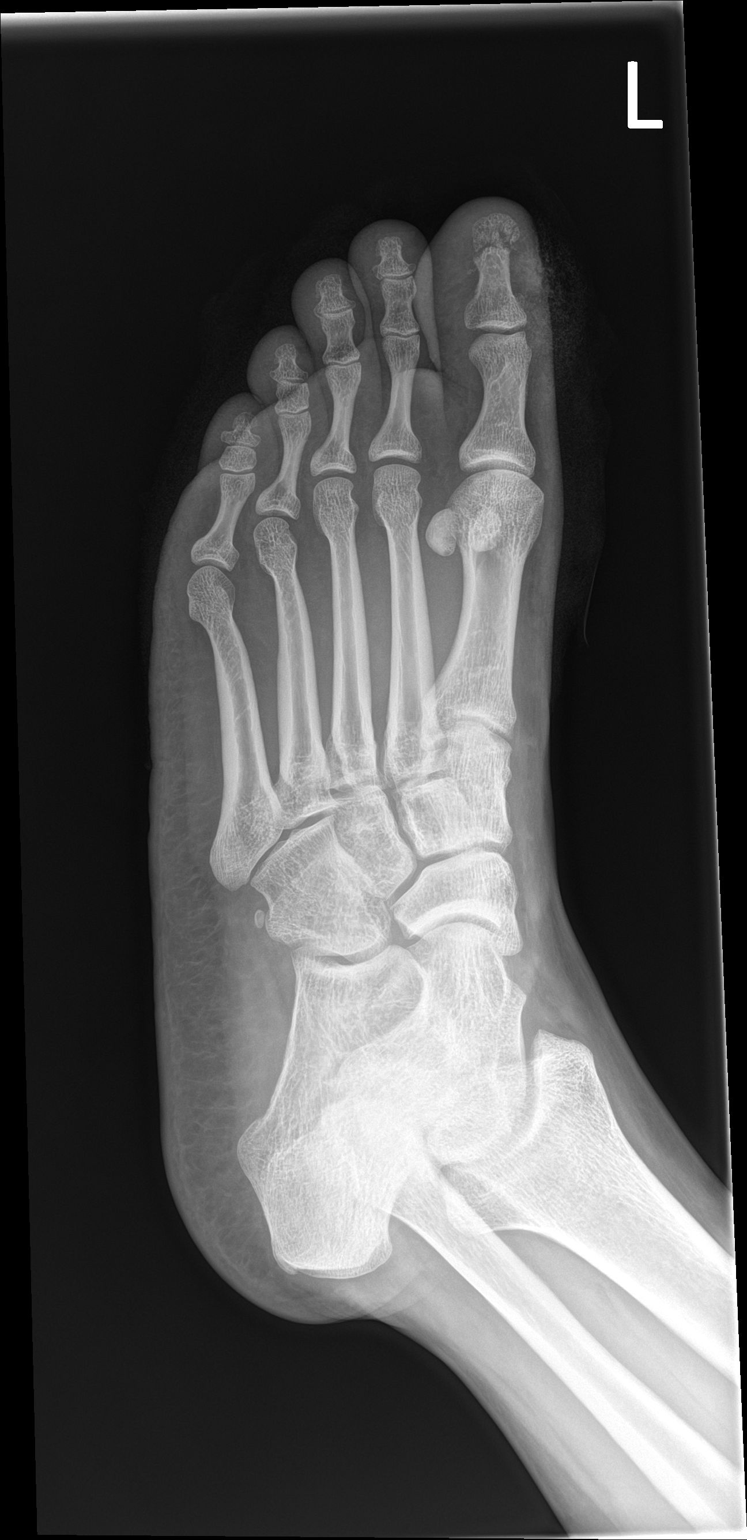

[leg]
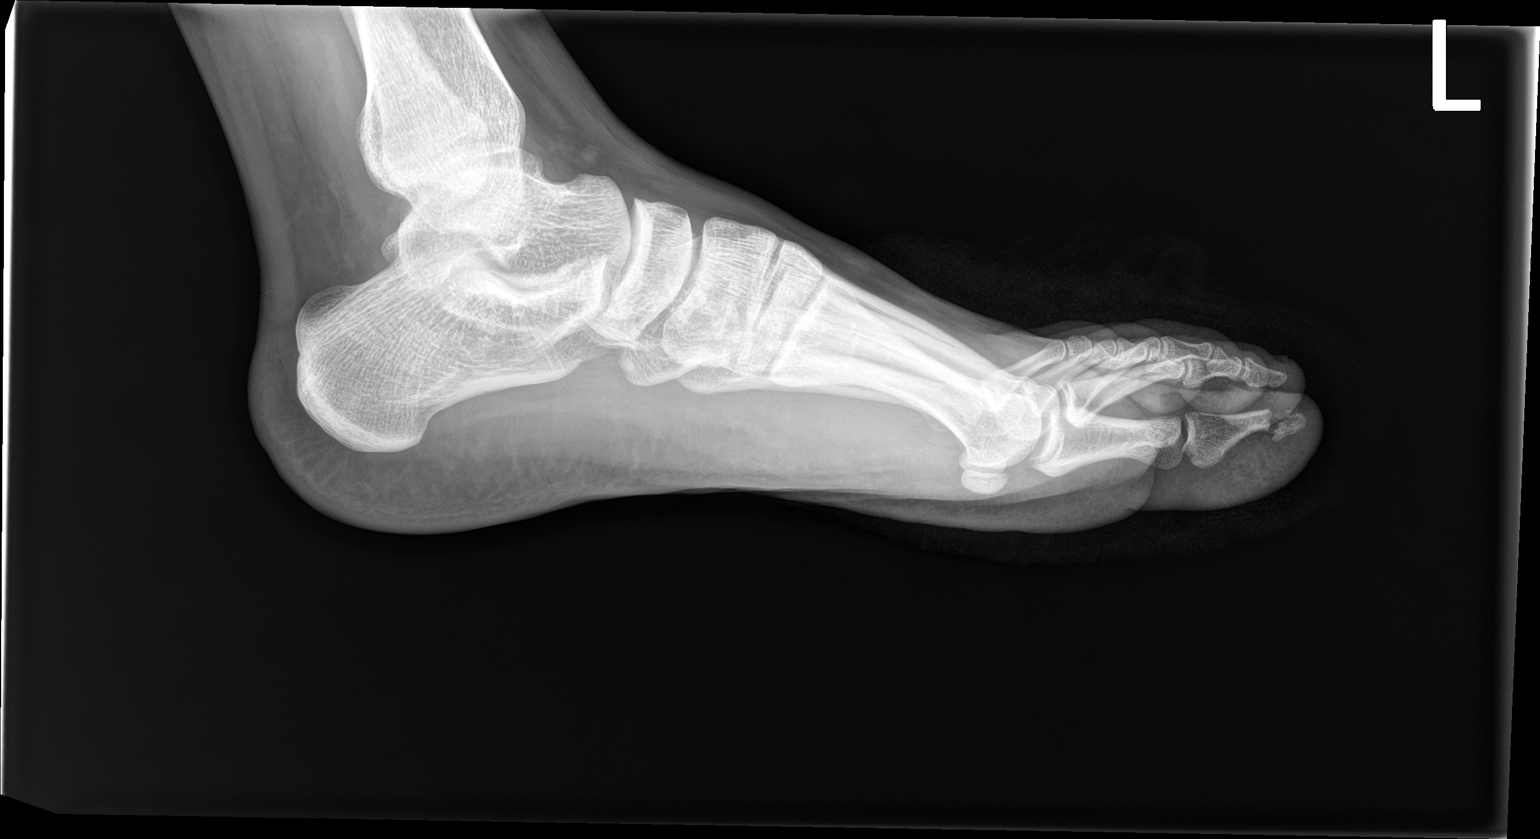

[3 of 3 positions shown; findings below may reference images not displayed]

FINDINGS: Acute mildly comminuted fracture involving tuft of first distal
phalanx with about [DATE] shaft diameter volar displacement of distal
fracture fragment. No subluxation
IMPRESSION: Acute mildly displaced fracture involving the tuft of the first
distal phalanx
